# Patient Record
Sex: Male | Born: 2000 | Hispanic: No | Marital: Single | Smoking: Never smoker
Health system: Southern US, Community
[De-identification: ages and names within clinical notes are randomized; demographics above are authoritative.]

---

## 2019-01-26 ENCOUNTER — Ambulatory Visit
Admission: EM | Admit: 2019-01-26 | Discharge: 2019-01-26 | Disposition: A | Discharge status: Home / Self Care | Payer: Medicaid Other | Attending: Emergency Medicine | Admitting: Emergency Medicine

## 2019-01-26 ENCOUNTER — Other Ambulatory Visit: Payer: Self-pay

## 2019-01-26 DIAGNOSIS — J038 Acute tonsillitis due to other specified organisms: Secondary | ICD-10-CM

## 2019-01-26 DIAGNOSIS — J351 Hypertrophy of tonsils: Secondary | ICD-10-CM | POA: Diagnosis present

## 2019-01-26 LAB — POCT RAPID STREP A (OFFICE): Rapid Strep A Screen: NEGATIVE

## 2019-01-26 MED ORDER — PREDNISONE 20 MG PO TABS: 20.0000 mg | ORAL_TABLET | Freq: Two times a day (BID) | ORAL | 0 refills | Status: AC

## 2019-01-26 NOTE — ED Triage Notes (Signed)
Pt states the right side of his jaw is swollen and is concerned may be an abscess developing

## 2019-01-26 NOTE — Discharge Instructions (Signed)
Strep test negative, will send out for culture and we will call you with results Get plenty of rest and push fluids Prednisone prescribed.  Take as directed and to completion Follow up with PCP if symptoms persists Return or go to ER if patient has any new or worsening symptoms such as fever, chills, nausea, vomiting, worsening sore throat, cough, abdominal pain, chest pain, changes in bowel or bladder habits, etc..Marland Kitchen

## 2019-01-26 NOTE — ED Provider Notes (Signed)
[facility]   [id number] 01/26/19 Arrival Time: 1738  CC: Tonsil swelling, concern for abscess  SUBJECTIVE: History from: patient.  Marc Frank is a 18 y.o. male who presents with right tonsil swelling x 1.5 weeks.  Denies to sick exposure to COVID, strep, flu or mono, or precipitating event.  Has NOT tried OTC medications  Symptoms are made slightly worse with swallowing, but tolerating liquids and own secretions without difficulty.  Reports previous symptoms in the past and diagnose with an tonsil abscess.  Was treated with antibiotics with relief.   Denies fever, chills, fatigue, ear pain, sinus pain, rhinorrhea, nasal congestion, dyspnea, odynophagia, tongue swelling, drooling, cough, SOB, wheezing, chest pain, nausea, rash, changes in bowel or bladder habits.     ROS: As per HPI.  All other pertinent ROS negative.     History reviewed. No pertinent past medical history. History reviewed. No pertinent surgical history. No Known Allergies No current facility-administered medications on file prior to encounter.    No current outpatient medications on file prior to encounter.   Social History   Socioeconomic History  . Marital status: Single    Spouse name: Not on file  . Number of children: Not on file  . Years of education: Not on file  . Highest education level: Not on file  Occupational History  . Not on file  Social Needs  . Financial resource strain: Not on file  . Food insecurity    Worry: Not on file    Inability: Not on file  . Transportation needs    Medical: Not on file    Non-medical: Not on file  Tobacco Use  . Smoking status: Never Smoker  . Smokeless tobacco: Never Used  Substance and Sexual Activity  . Alcohol use: Not Currently  . Drug use: Never  . Sexual activity: Not on file  Lifestyle  . Physical activity    Days per week: Not on file    Minutes per session: Not on file  . Stress: Not on file  Relationships  . Social Wellsite geologist on phone: Not on file    Gets together: Not on file    Attends religious service: Not on file    Active member of club or organization: Not on file    Attends meetings of clubs or organizations: Not on file    Relationship status: Not on file  . Intimate partner violence    Fear of current or ex partner: Not on file    Emotionally abused: Not on file    Physically abused: Not on file    Forced sexual activity: Not on file  Other Topics Concern  . Not on file  Social History Narrative  . Not on file   Family History  Family history unknown: Yes    OBJECTIVE:  Vitals:   01/26/19 1749  BP: (!) 142/97  Pulse: 78  Resp: 20  Temp: 99.1 F (37.3 C)  SpO2: 97%     General appearance: alert; appear, well-appearing, nontoxic, speaking in full sentences and managing own secretions HEENT: NCAT; Ears: EACs clear, TMs pearly gray with visible cone of light, without erythema; Eyes: PERRL, EOMI grossly; Nose: no obvious rhinorrhea; Throat: oropharynx clear, tonsils 1+ R>L and not erythematous, no white tonsillar exudates, uvula midline Neck: supple without LAD Lungs: CTA bilaterally without adventitious breath sounds; cough absent Heart: regular rate and rhythm.  Radial pulses 2+ symmetrical bilaterally Skin: warm and dry Psychological: alert and cooperative;  normal mood and affect  LABS: Results for orders placed or performed during the hospital encounter of 01/26/19 (from the past 24 hour(s))  POCT rapid strep A     Status: None   Collection Time: 01/26/19  6:18 PM  Result Value Ref Range   Rapid Strep A Screen Negative Negative     ASSESSMENT & PLAN:  1. Acute tonsillitis due to other specified organisms   2. Swelling of tonsil     Meds ordered this encounter  Medications  . predniSONE (DELTASONE) 20 MG tablet    Sig: Take 1 tablet (20 mg total) by mouth 2 (two) times daily with a meal for 5 days.    Dispense:  10 tablet    Refill:  0    Order Specific  Question:   Supervising Provider    Answer:   Raylene Everts [[id number]]   Strep test negative, will send out for culture and we will call you with results Get plenty of rest and push fluids Prednisone prescribed.  Take as directed and to completion Follow up with PCP if symptoms persists Return or go to ER if patient has any new or worsening symptoms such as fever, chills, nausea, vomiting, worsening sore throat, cough, abdominal pain, chest pain, changes in bowel or bladder habits, etc...  Reviewed expectations re: course of current medical issues. Questions answered. Outlined signs and symptoms indicating need for more acute intervention. Patient verbalized understanding. After Visit Summary given.        Lestine Box, PA-C 01/27/19 1015

## 2019-01-30 LAB — CULTURE, GROUP A STREP (THRC)

## 2019-02-21 ENCOUNTER — Emergency Department (HOSPITAL_COMMUNITY): Payer: Medicaid Other

## 2019-02-21 ENCOUNTER — Encounter (HOSPITAL_COMMUNITY): Payer: Self-pay

## 2019-02-21 ENCOUNTER — Emergency Department (HOSPITAL_COMMUNITY)
Admission: EM | Admit: 2019-02-21 | Discharge: 2019-02-21 | Disposition: A | Discharge status: Home / Self Care | Payer: Medicaid Other | Attending: Emergency Medicine | Admitting: Emergency Medicine

## 2019-02-21 ENCOUNTER — Other Ambulatory Visit: Payer: Self-pay

## 2019-02-21 DIAGNOSIS — K047 Periapical abscess without sinus: Secondary | ICD-10-CM | POA: Insufficient documentation

## 2019-02-21 DIAGNOSIS — R252 Cramp and spasm: Secondary | ICD-10-CM | POA: Diagnosis not present

## 2019-02-21 DIAGNOSIS — R600 Localized edema: Secondary | ICD-10-CM | POA: Diagnosis present

## 2019-02-21 LAB — I-STAT CHEM 8, ED
BUN: 8 mg/dL (ref 6–20)
Calcium, Ion: 1.26 mmol/L (ref 1.15–1.40)
Chloride: 102 mmol/L (ref 98–111)
Creatinine, Ser: 0.9 mg/dL (ref 0.61–1.24)
Glucose, Bld: 80 mg/dL (ref 70–99)
HCT: 46 % (ref 39.0–52.0)
Hemoglobin: 15.6 g/dL (ref 13.0–17.0)
Potassium: 4.1 mmol/L (ref 3.5–5.1)
Sodium: 141 mmol/L (ref 135–145)
TCO2: 27 mmol/L (ref 22–32)

## 2019-02-21 MED ORDER — CLINDAMYCIN HCL 150 MG PO CAPS: 300.0000 mg | ORAL_CAPSULE | Freq: Three times a day (TID) | ORAL | 0 refills | Status: DC

## 2019-02-21 MED ORDER — CLINDAMYCIN HCL 150 MG PO CAPS
300.0000 mg | ORAL_CAPSULE | Freq: Once | ORAL | Status: AC
  Administered 2019-02-21: 300 mg via ORAL
  Filled 2019-02-21: qty 2

## 2019-02-21 MED ORDER — IOHEXOL 300 MG/ML  SOLN
75.0000 mL | Freq: Once | INTRAMUSCULAR | Status: AC | PRN
  Administered 2019-02-21: 75 mL via INTRAVENOUS

## 2019-02-21 NOTE — ED Provider Notes (Signed)
[facility] EMERGENCY DEPARTMENT Provider Note   CSN: [id number] Arrival date & time: 02/21/19  1418     History   Chief Complaint Chief Complaint  Patient presents with  . Facial Swelling    HPI Marc Frank is a 18 y.o. male.  He presents emergency department with chief complaint of right facial swelling.  He was seen several days ago at an urgent care diagnosed with tonsillitis but states that he did not have any tonsillar pain that he has had pain in his right lower jaw which has been progressively worsening and is now markedly edematous and swollen.  He is has associated trismus without difficulty swallowing.  Patient denies fever or chills.  He is not taking any antibiotics.     HPI  History reviewed. No pertinent past medical history.  There are no active problems to display for this patient.   History reviewed. No pertinent surgical history.      Home Medications    Prior to Admission medications   Not on File    Family History Family History  Family history unknown: Yes    Social History Social History   Tobacco Use  . Smoking status: Never Smoker  . Smokeless tobacco: Never Used  Substance Use Topics  . Alcohol use: Not Currently  . Drug use: Never     Allergies   Patient has no known allergies.   Review of Systems Review of Systems  Ten systems reviewed and are negative for acute change, except as noted in the HPI.   Physical Exam Updated Vital Signs BP 134/68 (BP Location: Right Arm)   Pulse 89   Temp 98.5 F (36.9 C) (Oral)   Resp 18   Ht 6\' 3"  (1.905 m)   Wt 113.4 kg   SpO2 98%   BMI 31.25 kg/m   Physical Exam Vitals signs and nursing note reviewed.  Constitutional:      General: He is not in acute distress.    Appearance: He is well-developed. He is not diaphoretic.  HENT:     Head: Normocephalic and atraumatic.  Eyes:     General: No scleral icterus.    Conjunctiva/sclera: Conjunctivae normal.     Comments: Poor  dentition throughout most notably in the right lower molars.  Marked facial edema over the right mandible, involvement of the masseter with associated trismus.  Uvula is midline, no tonsillar hypertrophy, erythema or exudate noted.  Firm indurated swelling over the mandible on the right side extends beyond the angle of the mandible into the hypopharyngeal region and superior aspect of the anterior neck just below the angle of the mandible.  Neck:     Musculoskeletal: Normal range of motion and neck supple.  Cardiovascular:     Rate and Rhythm: Normal rate and regular rhythm.     Heart sounds: Normal heart sounds.  Pulmonary:     Effort: Pulmonary effort is normal. No respiratory distress.     Breath sounds: Normal breath sounds.  Abdominal:     Palpations: Abdomen is soft.     Tenderness: There is no abdominal tenderness.  Skin:    General: Skin is warm and dry.  Neurological:     Mental Status: He is alert.  Psychiatric:        Behavior: Behavior normal.      ED Treatments / Results  Labs (all labs ordered are listed, but only abnormal results are displayed) Labs Reviewed - No data to display  EKG None  Radiology No results found.  Procedures Procedures (including critical care time)  Medications Ordered in ED Medications - No data to display   Initial Impression / Assessment and Plan / ED Course  I have reviewed the triage vital signs and the nursing notes.  Pertinent labs & imaging results that were available during my care of the patient were reviewed by me and considered in my medical decision making (see chart for details).       18 year old male here with significant swelling and trismus.  Personally reviewed the patient's lab work which shows no abnormalities.  CT maxillofacial shows significant edema and fluid collection suggestive of odontogenic infection.  He has surrounding inflammatory changes of the masseter and pterygoid.  There are some reactive  lymphadenopathy present as well on my interpretation. Patient will be given clindamycin for treatment with outpatient dental follow-up.  I am unable to palpate a area of fluctuance in order to drain this abscess.  He has no evidence of Ludwick's angina or spread of infection into the anterior neck spaces.  His uvula is midline, he has normal phonation and swallowing appropriately with tolerance of his own saliva.  Patient appears appropriate for discharge at this time with close outpatient dental follow-up.  I discussed return precautions.  Final Clinical Impressions(s) / ED Diagnoses   Final diagnoses:  None    ED Discharge Orders    None       Margarita Mail, PA-C 02/21/19 1942    Noemi Chapel, MD 02/22/19 [phone]

## 2019-02-21 NOTE — Discharge Instructions (Signed)
Contact a health care provider if: °Your pain is worse and is not helped by medicine. °Get help right away if: °You have a fever or chills. °Your symptoms suddenly get worse. °You have a very bad headache. °You have problems breathing or swallowing. °You have trouble opening your mouth. °You have swelling in your neck or around your eye. °

## 2019-02-21 NOTE — ED Triage Notes (Signed)
Pt reports r jaw swollen x 1 month.  Denies any pain or injury.

## 2019-05-01 ENCOUNTER — Encounter (HOSPITAL_COMMUNITY): Payer: Self-pay | Admitting: Emergency Medicine

## 2019-05-01 ENCOUNTER — Emergency Department (HOSPITAL_COMMUNITY)
Admission: EM | Admit: 2019-05-01 | Discharge: 2019-05-01 | Disposition: A | Discharge status: Home / Self Care | Payer: Medicaid Other | Attending: Emergency Medicine | Admitting: Emergency Medicine

## 2019-05-01 ENCOUNTER — Other Ambulatory Visit: Payer: Self-pay

## 2019-05-01 DIAGNOSIS — K029 Dental caries, unspecified: Secondary | ICD-10-CM | POA: Insufficient documentation

## 2019-05-01 DIAGNOSIS — R600 Localized edema: Secondary | ICD-10-CM | POA: Diagnosis present

## 2019-05-01 DIAGNOSIS — L0291 Cutaneous abscess, unspecified: Secondary | ICD-10-CM

## 2019-05-01 DIAGNOSIS — L0201 Cutaneous abscess of face: Secondary | ICD-10-CM | POA: Insufficient documentation

## 2019-05-01 MED ORDER — CLINDAMYCIN HCL 300 MG PO CAPS: 300.0000 mg | ORAL_CAPSULE | Freq: Three times a day (TID) | ORAL | 0 refills | Status: AC

## 2019-05-01 MED ORDER — CLINDAMYCIN HCL 150 MG PO CAPS
300.0000 mg | ORAL_CAPSULE | Freq: Once | ORAL | Status: AC
  Administered 2019-05-01: 300 mg via ORAL
  Filled 2019-05-01: qty 2

## 2019-05-01 NOTE — ED Triage Notes (Signed)
PT STATES THAT HE HAS AN INGROWN HAIR ON THE RIGHT SIDE OF HIS JAW

## 2019-05-01 NOTE — Discharge Instructions (Signed)
Warm compress to your face.  Take the antibiotics has needed.  If you if you decide that you do want this abscess drained which he did not want today please seek reevaluation or urgent care or here in the emergency department

## 2019-05-01 NOTE — ED Provider Notes (Signed)
[facility] Provider Note   CSN: [id number] Arrival date & time: 05/01/19  1439     History Chief Complaint  Patient presents with  . Abscess    Marc Frank is a 19 y.o. male with past medical history significant for right-sided facial swelling.  Patient states he shaved a week and a half ago and since then has noted a possible ingrown hair and facial swelling to the right side of his face.  Patient denies any dental pain.  Patient states that he did have similar pain a couple months ago and was seen here in the emergency department.  Patient diagnosed with abscess likely due to dental infection.  He was started on clindamycin.  Patient states the clindamycin resolved his facial swelling.  He is not currently taking any antibiotics.  Patient denies fever, chills, nausea, vomiting, drooling, dysphagia, trismus.  He is able to tolerate p.o. intake without difficulty.  Denies any oral swelling.  Rates his current pain a 2/10.  Denies additional aggravating or alleviating factors.  History obtained from patient and past medical records.  No interpreter is used.  HPI     History reviewed. No pertinent past medical history.  There are no problems to display for this patient.   History reviewed. No pertinent surgical history.     Family History  Family history unknown: Yes    Social History   Tobacco Use  . Smoking status: Never Smoker  . Smokeless tobacco: Never Used  Substance Use Topics  . Alcohol use: Not Currently  . Drug use: Never    Home Medications Prior to Admission medications   Medication Sig Start Date End Date Taking? Authorizing Provider  clindamycin (CLEOCIN) 300 MG capsule Take 1 capsule (300 mg total) by mouth 3 (three) times daily for 5 days. 05/01/19 05/06/19  Reilley Latorre A, PA-C    Allergies    Patient has no known allergies.  Review of Systems   Review of Systems  Constitutional: Negative.   HENT: Positive for facial  swelling. Negative for congestion, drooling, ear pain, hearing loss, mouth sores, nosebleeds, postnasal drip, rhinorrhea, sinus pressure, sinus pain, sore throat, trouble swallowing and voice change.   Eyes: Negative.   Respiratory: Negative.   Cardiovascular: Negative.   Gastrointestinal: Negative.   Genitourinary: Negative.   Musculoskeletal: Negative.   Skin: Negative.   Neurological: Negative.   All other systems reviewed and are negative.   Physical Exam Updated Vital Signs BP 132/69 (BP Location: Right Arm)   Pulse 96   Temp 98.5 F (36.9 C) (Oral)   Resp 14   Ht 6\' 3"  (1.905 m)   Wt 113.4 kg   SpO2 98%   BMI 31.25 kg/m   Physical Exam Vitals and nursing note reviewed.  Constitutional:      General: He is not in acute distress.    Appearance: He is well-developed. He is not ill-appearing, toxic-appearing or diaphoretic.  HENT:     Head: Atraumatic.     Jaw: There is normal jaw occlusion.     Comments: Facial swelling just superior to his right mandible.  This is not extend into his submandibular region.  He does have a 5 cm area of induration to his right supra mandibular region with 1 cm area of fluctuance to the most inferior aspect.  No drooling, dysphagia or trismus.  No oral jaw occlusion.  No evidence of Ludwigs angina.    Nose: Nose normal.     Mouth/Throat:  Lips: Pink.     Mouth: Mucous membranes are moist.     Pharynx: Oropharynx is clear. Uvula midline.     Tonsils: No tonsillar exudate or tonsillar abscesses. 0 on the right. 0 on the left.     Comments: Poor dentition with multiple dental caries.  Tongue midline.  No evidence of sublingual or submandibular swelling.  No drooling, dysphagia or trismus.  Tonsils without edema or exudate.  Uvula midline without deviation Eyes:     Pupils: Pupils are equal, round, and reactive to light.  Neck:     Trachea: Phonation normal.     Comments: No neck stiffness or neck rigidity.  Phonation  normal Cardiovascular:     Rate and Rhythm: Normal rate and regular rhythm.  Pulmonary:     Effort: Pulmonary effort is normal. No respiratory distress.  Abdominal:     General: There is no distension.     Palpations: Abdomen is soft.  Musculoskeletal:        General: Normal range of motion.     Cervical back: Full passive range of motion without pain, normal range of motion and neck supple.  Skin:    General: Skin is warm and dry.     Capillary Refill: Capillary refill takes less than 2 seconds.     Findings: Abscess present.     Comments: Right supra mandibular jaw swelling.  With fluctuance and induration.  There is punctate area of purulent drainage.  No erythema or warmth.  Neurological:     Mental Status: He is alert.     ED Results / Procedures / Treatments   Labs (all labs ordered are listed, but only abnormal results are displayed) Labs Reviewed - No data to display  EKG None  Radiology No results found.  Procedures Procedures (including critical care time)  Medications Ordered in ED Medications  clindamycin (CLEOCIN) capsule 300 mg (has no administration in time range)    ED Course  I have reviewed the triage vital signs and the nursing notes.  Pertinent labs & imaging results that were available during my care of the patient were reviewed by me and considered in my medical decision making (see chart for details).  19 year old male appears otherwise well presents for evaluation of right-sided facial swelling.  Swelling began 1/2 weeks ago after shaving.  He has no drooling, dysphagia or trismus.  He does have marked area of induration to his right supra mandibular area.  His induration does not extend to his sublingual or submandibular region.  Is no evidence of Ludwick's angina.  He is tolerating p.o. intake at home.  He does have overall poor dentition however I do not see any evidence of drainable periapical abscess.  He has 1 cm area of fluctuance to the  inferior most aspect of the induration to his super mandibular region and there is some mild purulent drainage.  There is no overlying warmth or erythema.  No obvious area of folliculitis.  He was seen here 2 months ago for similar symptoms had CT scan which showed dental abscess.  Patient states the antibiotics and warm compresses help to resolve this area.  He did not follow-up outpatient.   I offered patient further drainage in the emergency department of his facial abscess discussed risk versus benefit to include incomplete drainage, cosmetic repercussions, possible deep space infection which would require labs and CT imaging.  Patient does not want any labs, CT imaging or drainage here in the emergency department.  Patient  would like to try warm compresses and antibiotics.  Given he has no drooling, dysphagia, trismus, no systemic symptoms and does not have any evidence of Ludwig's angina or deep space infection we will start him on antibiotics and have him follow-up with maxillofacial surgery.  I discussed strict return precautions.  Patient voiced understanding and is agreeable follow-up.  The patient has been appropriately medically screened and/or stabilized in the ED. I have low suspicion for any other emergent medical condition which would require further screening, evaluation or treatment in the ED or require inpatient management.  Patient is hemodynamically stable and in no acute distress.  Patient able to ambulate in department prior to ED.  Evaluation does not show acute pathology that would require ongoing or additional emergent interventions while in the emergency department or further inpatient treatment.  I have discussed the diagnosis with the patient and answered all questions.  Pain is been managed while in the emergency department and patient has no further complaints prior to discharge.  Patient is comfortable with plan discussed in room and is stable for discharge at this time.  I have  discussed strict return precautions for returning to the emergency department.  Patient was encouraged to follow-up with PCP/specialist refer to at discharge.    MDM Rules/Calculators/A&P                       Final Clinical Impression(s) / ED Diagnoses Final diagnoses:  Abscess    Rx / DC Orders ED Discharge Orders         Ordered    clindamycin (CLEOCIN) 300 MG capsule  3 times daily     05/01/19 1707           Quanita Barona A, PA-C 05/01/19 1708    Bethann Berkshire, MD 05/01/19 1955

## 2019-05-17 ENCOUNTER — Encounter (HOSPITAL_COMMUNITY): Payer: Self-pay | Admitting: *Deleted

## 2019-05-17 ENCOUNTER — Other Ambulatory Visit: Payer: Self-pay

## 2019-05-17 ENCOUNTER — Emergency Department (HOSPITAL_COMMUNITY)
Admission: EM | Admit: 2019-05-17 | Discharge: 2019-05-17 | Disposition: A | Discharge status: Home / Self Care | Payer: Worker's Compensation | Attending: Emergency Medicine | Admitting: Emergency Medicine

## 2019-05-17 ENCOUNTER — Emergency Department (HOSPITAL_COMMUNITY): Payer: Worker's Compensation

## 2019-05-17 DIAGNOSIS — Y9301 Activity, walking, marching and hiking: Secondary | ICD-10-CM | POA: Insufficient documentation

## 2019-05-17 DIAGNOSIS — Y990 Civilian activity done for income or pay: Secondary | ICD-10-CM | POA: Insufficient documentation

## 2019-05-17 DIAGNOSIS — M545 Low back pain, unspecified: Secondary | ICD-10-CM

## 2019-05-17 DIAGNOSIS — S63502A Unspecified sprain of left wrist, initial encounter: Secondary | ICD-10-CM | POA: Insufficient documentation

## 2019-05-17 DIAGNOSIS — W010XXA Fall on same level from slipping, tripping and stumbling without subsequent striking against object, initial encounter: Secondary | ICD-10-CM | POA: Insufficient documentation

## 2019-05-17 DIAGNOSIS — Y92511 Restaurant or cafe as the place of occurrence of the external cause: Secondary | ICD-10-CM | POA: Insufficient documentation

## 2019-05-17 MED ORDER — IBUPROFEN 400 MG PO TABS
600.0000 mg | ORAL_TABLET | Freq: Once | ORAL | Status: AC
  Administered 2019-05-17: 600 mg via ORAL

## 2019-05-17 MED ORDER — METHOCARBAMOL 500 MG PO TABS
1000.0000 mg | ORAL_TABLET | Freq: Once | ORAL | Status: AC
  Administered 2019-05-17: 07:00:00 1000 mg via ORAL

## 2019-05-17 NOTE — Discharge Instructions (Addendum)
Use ice packs over the areas that are painful.  Wear the Velcro wrist splint on your left wrist.  Please call the occupational medicine offices to be seen today for your Workmen's Comp injury.  They will guide the rest of your therapy.

## 2019-05-17 NOTE — ED Provider Notes (Signed)
[facility] EMERGENCY DEPARTMENT Provider Note   CSN: [id number] Arrival date & time: 05/17/19  0524   Time seen 5:45 AM  History Chief Complaint  Patient presents with  . Fall    Marc Frank is a 19 y.o. male.  HPI   Patient states he was working at [employer] and he was walking towards the back and when he passed the ovens there was some water on the floor and he slipped and fell backwards.  He put his left hand behind him to catch himself.  He denies hitting his head or having loss of consciousness.  He complains of pain in his lower back and in his left wrist which is still hurting but is improving.  Patient is right-handed.  He denies any injuries to his legs or any numbness or tingling in his legs.  He states that after he laid on the floor while waiting for EMS to come he was able to get up and he could walk.  He denies nausea, vomiting, or blurred vision.  PCP Patient, No Pcp Per   History reviewed. No pertinent past medical history.  There are no problems to display for this patient.   History reviewed. No pertinent surgical history.     Family History  Family history unknown: Yes    Social History   Tobacco Use  . Smoking status: Never Smoker  . Smokeless tobacco: Never Used  Substance Use Topics  . Alcohol use: Not Currently  . Drug use: Never  employed  Home Medications Prior to Admission medications   Not on File    Allergies    Patient has no known allergies.  Review of Systems   Review of Systems  All other systems reviewed and are negative.   Physical Exam Updated Vital Signs BP (!) 122/59   Pulse 78   Temp 98.1 F (36.7 C) (Oral)   Resp 16   Ht 6\' 3"  (1.905 m)   Wt 113.4 kg   SpO2 99%   BMI 31.25 kg/m   Physical Exam Vitals and nursing note reviewed.  Constitutional:      Appearance: Normal appearance. He is obese.  HENT:     Head: Normocephalic and atraumatic.     Comments: Nontender, no obvious injury seen    Nose:  Nose normal.  Eyes:     Extraocular Movements: Extraocular movements intact.     Conjunctiva/sclera: Conjunctivae normal.     Pupils: Pupils are equal, round, and reactive to light.  Cardiovascular:     Rate and Rhythm: Normal rate and regular rhythm.  Pulmonary:     Effort: Pulmonary effort is normal. No respiratory distress.  Musculoskeletal:     Cervical back: Normal range of motion and neck supple. No tenderness.       Back:     Right lower leg: No edema.     Left lower leg: No edema.     Comments: Patient is tender in his lower lumbar spine in the midline.  He is nontender over the paraspinous muscles or in the flank area on either side.  He is nontender in the thoracic spine or the cervical spine.  Patient's wrist is nontender to palpation or with dorsi flexion and extension, however when he moves his right thumb it causes pain in the radial aspect of his wrist.  He has good distal pulses.  Skin:    General: Skin is warm and dry.  Neurological:     General: No focal deficit present.  Mental Status: He is alert and oriented to person, place, and time.     Cranial Nerves: No cranial nerve deficit.  Psychiatric:        Mood and Affect: Mood normal.        Behavior: Behavior normal.        Thought Content: Thought content normal.     ED Results / Procedures / Treatments   Labs (all labs ordered are listed, but only abnormal results are displayed) Labs Reviewed - No data to display  EKG None  Radiology DG Lumbar Spine Complete  Result Date: 05/17/2019 CLINICAL DATA:  Larey Seat backwards at work this morning EXAM: LUMBAR SPINE - COMPLETE 4+ VIEW COMPARISON:  None. FINDINGS: Transitional anatomy with only 4 non-rib-bearing lumbar type vertebral bodies. No acute fracture or vertebral body height loss is seen. No traumatic listhesis. No significant degenerative changes. No worrisome osseous lesions. IMPRESSION: No acute osseous abnormality. Likely variant transitional anatomy  with only 4 non-rib-bearing lumbar type vertebral bodies. Electronically Signed   By: Kreg Shropshire M.D.   On: 05/17/2019 06:30   DG Wrist Complete Left  Result Date: 05/17/2019 CLINICAL DATA:  Fall with left wrist pain laterally EXAM: LEFT WRIST - COMPLETE 3+ VIEW COMPARISON:  None. FINDINGS: Mild left wrist swelling. No acute bony abnormality. Specifically, no fracture, subluxation, or dislocation. IMPRESSION: Mild left wrist swelling without underlying osseous abnormality. Electronically Signed   By: Kreg Shropshire M.D.   On: 05/17/2019 06:31    Procedures Procedures (including critical care time)  Medications Ordered in ED Medications  ibuprofen (ADVIL) tablet 600 mg (600 mg Oral Given 05/17/19 1655)  methocarbamol (ROBAXIN) tablet 1,000 mg (1,000 mg Oral Given 05/17/19 3748)    ED Course  I have reviewed the triage vital signs and the nursing notes.  Pertinent labs & imaging results that were available during my care of the patient were reviewed by me and considered in my medical decision making (see chart for details).    MDM Rules/Calculators/A&P                      Patient was given Motrin and Robaxin for his pain.  He was placed in a Velcro wrist splint on his left wrist, we discussed treatment of sprain wrist.  He also should follow-up today at occupational medication. Final Clinical Impression(s) / ED Diagnoses Final diagnoses:  Fall due to wet surface, initial encounter  Acute midline low back pain without sciatica  Sprain of left wrist, initial encounter    Rx / DC Orders ED Discharge Orders    None     Plan discharge  Devoria Albe, MD, Concha Pyo, MD 05/17/19 [phone]

## 2019-05-17 NOTE — ED Triage Notes (Signed)
Pt brought in by [facility] for c/o fall while at work; pt states he was walking to the freezer to get out some sausage when he slipped and fell near the stoves, landing on his back and left wrist

## 2019-07-15 ENCOUNTER — Other Ambulatory Visit: Payer: Self-pay

## 2019-07-15 ENCOUNTER — Ambulatory Visit: Payer: Medicaid Other | Attending: Internal Medicine

## 2019-07-15 DIAGNOSIS — Z20822 Contact with and (suspected) exposure to covid-19: Secondary | ICD-10-CM | POA: Insufficient documentation

## 2019-07-16 LAB — SARS-COV-2, NAA 2 DAY TAT

## 2019-07-16 LAB — NOVEL CORONAVIRUS, NAA: SARS-CoV-2, NAA: NOT DETECTED

## 2021-07-12 ENCOUNTER — Other Ambulatory Visit: Payer: Self-pay | Admitting: Physician Assistant

## 2021-07-12 DIAGNOSIS — K3532 Acute appendicitis with perforation and localized peritonitis, without abscess: Secondary | ICD-10-CM

## 2021-07-15 ENCOUNTER — Other Ambulatory Visit: Payer: Self-pay | Admitting: Physician Assistant

## 2021-07-15 DIAGNOSIS — K3532 Acute appendicitis with perforation and localized peritonitis, without abscess: Secondary | ICD-10-CM

## 2021-07-23 ENCOUNTER — Ambulatory Visit
Admission: RE | Admit: 2021-07-23 | Discharge: 2021-07-23 | Disposition: A | Discharge status: Home / Self Care | Payer: Medicaid Other | Source: Ambulatory Visit | Attending: Physician Assistant | Admitting: Physician Assistant

## 2021-07-23 ENCOUNTER — Encounter: Payer: Self-pay | Admitting: *Deleted

## 2021-07-23 DIAGNOSIS — K3532 Acute appendicitis with perforation and localized peritonitis, without abscess: Secondary | ICD-10-CM

## 2021-07-23 HISTORY — PX: IR RADIOLOGIST EVAL & MGMT: IMG5224

## 2021-07-23 MED ORDER — IOPAMIDOL (ISOVUE-300) INJECTION 61%
100.0000 mL | Freq: Once | INTRAVENOUS | Status: AC | PRN
  Administered 2021-07-23: 100 mL via INTRAVENOUS

## 2021-07-23 NOTE — Progress Notes (Signed)
? ? ?Chief Complaint: ?Patient was seen in consultation today for drain management ? ?Referring Physician(s): ?Mohamed-Omar Samir Arafeh ? ?History of Present Illness: ?Marc Frank is a 21 y.o. male with history of perforated appendicitis and was admitted at [facility] on 07/04/2021.  Patient had a CT-guided transgluteal pelvic abscess drain placed on 07/08/2021.  Patient was discharged from the hospital on 07/12/2021.  Patient presents for drain follow-up.  He says that he is feeling well with minimal discomfort.  Patient is not complaining of fevers.  Patient reports minimal output from the drain.  He says the output is relatively clear. ? ?Allergies: ?Patient has no known allergies. ? ?Medications: ?Prior to Admission medications   ?Not on File  ?  ? ?Family History  ?Family history unknown: Yes  ? ? ?Social History  ? ?Socioeconomic History  ? Marital status: Single  ?  Spouse name: Not on file  ? Number of children: Not on file  ? Years of education: Not on file  ? Highest education level: Not on file  ?Occupational History  ? Not on file  ?Tobacco Use  ? Smoking status: Never  ? Smokeless tobacco: Never  ?Substance and Sexual Activity  ? Alcohol use: Not Currently  ? Drug use: Never  ? Sexual activity: Not on file  ?Other Topics Concern  ? Not on file  ?Social History Narrative  ? Not on file  ? ?Social Determinants of Health  ? ?Financial Resource Strain: Not on file  ?Food Insecurity: Not on file  ?Transportation Needs: Not on file  ?Physical Activity: Not on file  ?Stress: Not on file  ?Social Connections: Not on file  ? ? ? ? ?Review of Systems  ?Constitutional:  Negative for fever.  ? ?Vital Signs: ?There were no vitals taken for this visit. ? ?Physical Exam ?Constitutional:   ?   Appearance: He is not ill-appearing.  ?Pulmonary:  ?   Effort: Pulmonary effort is normal.  ?Abdominal:  ?   Comments: Transgluteal drain intact  ?Neurological:  ?   Mental Status: He is alert.  ?   ? ?Imaging: ?CT ABDOMEN PELVIS W CONTRAST ? ?Result Date: 07/23/2021 ?CLINICAL DATA:  21 year old with history of perforated appendicitis. Status post transgluteal percutaneous drain placement. EXAM: CT ABDOMEN AND PELVIS WITH CONTRAST TECHNIQUE: Multidetector CT imaging of the abdomen and pelvis was performed using the standard protocol following bolus administration of intravenous contrast. RADIATION DOSE REDUCTION: This exam was performed according to the departmental dose-optimization program which includes automated exposure control, adjustment of the mA and/or kV according to patient size and/or use of iterative reconstruction technique. CONTRAST:  ISOVUE-300 IOPAMIDOL (ISOVUE-300) INJECTION 61% COMPARISON:  CT abdomen and pelvis 07/07/2021 FINDINGS: Lower chest: Lung bases are clear. Hepatobiliary: No focal liver abnormality is seen. No gallstones, gallbladder wall thickening, or biliary dilatation. Pancreas: Unremarkable. No pancreatic ductal dilatation or surrounding inflammatory changes. Spleen: Normal in size without focal abnormality. Adrenals/Urinary Tract: Normal adrenal glands. Normal appearance of both kidneys without hydronephrosis. No suspicious renal lesions. Normal urinary bladder. Stomach/Bowel: Normal appearance of the stomach. Small bowel dilatation have resolved. Appendix is decompressed but the tip is near an intra-abdominal air-fluid collection. Residual but decreased inflammatory changes in the right lower quadrant around the appendix. Colon is decompressed. Vascular/Lymphatic: Vascular structures are unremarkable. No lymph node enlargement in the abdomen or pelvis. Reproductive: Prostate is unremarkable. Other: Right transgluteal pigtail drain is present and the pelvic abscess collection has resolved. Several small intra-abdominal fluid collections  compatible with small abscess collections. There is a small air-fluid collection near the tip of the appendix on sequence 2 image 69 that  measures 4.3 x 2.6 cm. Additional lower abdominal air-fluid collection on sequence 2, image 62 that measures 3.0 x 2.6 cm. Fluid collection along the anterior distal descending colon measures 4.5 x 3.3 x 2.6 cm on sequence 2 image 50. Additional interloop fluid collection in the left abdomen on image 40 measures 3.8 x 2.3 x 3.9 cm. Musculoskeletal: No acute osseous abnormalities. IMPRESSION: 1. Pelvic abscess has resolved with the percutaneous drain. 2. Decreased inflammatory changes in the abdomen and pelvis but evidence for small intra-abdominal abscess collections. Two of the collections are in the lower abdomen near the tip of the appendix and the additional fluid collections are along the left lateral abdomen. 3. Small bowel dilatation has resolved.  Appendix is decompressed. Electronically Signed   By: Richarda Overlie M.D.   On: 07/23/2021 14:10   ? ? ? ?Assessment and Plan: ? ?21 year old with history of perforated appendicitis and status post percutaneous drain for pelvic abscess.  Patient has completed his antibiotics and had a CT scan today.  CT demonstrates that the pelvic abscess has resolved and the patient has minimal output from the drain.  However, the patient has multiple small intra-abdominal fluid collections that are suggestive for small residual abscess collections.  Overall, the inflammatory changes in the abdomen and pelvis have decreased.  The small bowel and appendix dilatation have resolved.  The transgluteal abscess drain was removed without complication.  Bandage was placed at the drain site.  Patient is scheduled to follow-up with Mohamed-Omar Shary Key and we discussed getting a follow-up CT in approximately 2 weeks to reassess the small intra-abdominal fluid/abscess collections. ? ? ?Electronically Signed: ?Arn Medal ?07/23/2021, 2:15 PM ? ? ?I spent a total of  10 minutes in face to face in clinical consultation, greater than 50% of which was counseling/coordinating care for drain  management. Patient ID: Marc Frank, male   DOB: January 08, 2001, 21 y.o.   MRN: [id number] ? ?

## 2021-07-24 ENCOUNTER — Other Ambulatory Visit: Payer: Self-pay | Admitting: Physician Assistant

## 2021-07-25 ENCOUNTER — Other Ambulatory Visit: Payer: Self-pay | Admitting: Physician Assistant

## 2021-07-25 DIAGNOSIS — K3532 Acute appendicitis with perforation and localized peritonitis, without abscess: Secondary | ICD-10-CM

## 2021-08-06 ENCOUNTER — Ambulatory Visit
Admission: RE | Admit: 2021-08-06 | Discharge: 2021-08-06 | Disposition: A | Discharge status: Home / Self Care | Payer: Medicaid Other | Source: Ambulatory Visit | Attending: Physician Assistant | Admitting: Physician Assistant

## 2021-08-06 ENCOUNTER — Encounter: Payer: Self-pay | Admitting: *Deleted

## 2021-08-06 DIAGNOSIS — K3532 Acute appendicitis with perforation and localized peritonitis, without abscess: Secondary | ICD-10-CM

## 2021-08-06 HISTORY — PX: IR RADIOLOGIST EVAL & MGMT: IMG5224

## 2021-08-06 MED ORDER — IOPAMIDOL (ISOVUE-300) INJECTION 61%
100.0000 mL | Freq: Once | INTRAVENOUS | Status: AC | PRN
  Administered 2021-08-06: 100 mL via INTRAVENOUS

## 2021-08-06 NOTE — Progress Notes (Addendum)
? ?Referring Physician(s): ?Dr Luciana Axe ? ?Chief Complaint: ?The patient is seen in follow up today s/p  ? ?History of present illness: ? ?Hx ruptured appendix ?TG drain placed in IR 07/08/21 ?Was seen in follow up 4/4 with Dr Lowella Dandy ?Collection was resolved and TG drain was removed ?At time of visit-- CT was performed which showed some new small collections in pelvis ?IMPRESSION: ?1. Pelvic abscess has resolved with the percutaneous drain. ?2. Decreased inflammatory changes in the abdomen and pelvis but ?evidence for small intra-abdominal abscess collections. Two of the ?collections are in the lower abdomen near the tip of the appendix ?and the additional fluid collections are along the left lateral ?abdomen. ?3. Small bowel dilatation has resolved.  Appendix is decompressed. ? ?Dr Lowella Dandy discussed with Surgeon ?Plan was to return to Clinic today for repeat CT and evaluation ?Scheduled today for same ? ?Pt is without complaint ?Denies fever; chills; abd pain ?Eating and drinking normally ?BMs wnl ? ? ?No past medical history on file. ? ?Past Surgical History:  ?Procedure Laterality Date  ? IR RADIOLOGIST EVAL & MGMT  07/23/2021  ? ? ?Allergies: ?Patient has no known allergies. ? ?Medications: ?Prior to Admission medications   ?Not on File  ?  ? ?Family History  ?Family history unknown: Yes  ? ? ?Social History  ? ?Socioeconomic History  ? Marital status: Single  ?  Spouse name: Not on file  ? Number of children: Not on file  ? Years of education: Not on file  ? Highest education level: Not on file  ?Occupational History  ? Not on file  ?Tobacco Use  ? Smoking status: Never  ? Smokeless tobacco: Never  ?Substance and Sexual Activity  ? Alcohol use: Not Currently  ? Drug use: Never  ? Sexual activity: Not on file  ?Other Topics Concern  ? Not on file  ?Social History Narrative  ? Not on file  ? ?Social Determinants of Health  ? ?Financial Resource Strain: Not on file  ?Food Insecurity: Not on file  ?Transportation  Needs: Not on file  ?Physical Activity: Not on file  ?Stress: Not on file  ?Social Connections: Not on file  ? ? ? ?Vital Signs: ?There were no vitals taken for this visit. ? ?Physical Exam ?Skin: ?   General: Skin is warm.  ?   Comments: Site of Rt TG drain is completely healed ?No redness ?No sign of infection  ? ? ?Imaging: ?No results found. ? ?Labs: ? ?CBC: ?No results for input(s): WBC, HGB, HCT, PLT in the last 8760 hours. ? ?COAGS: ?No results for input(s): INR, APTT in the last 8760 hours. ? ?BMP: ?No results for input(s): NA, K, CL, CO2, GLUCOSE, BUN, CALCIUM, CREATININE, GFRNONAA, GFRAA in the last 8760 hours. ? ?Invalid input(s): CMP ? ?LIVER FUNCTION TESTS: ?No results for input(s): BILITOT, AST, ALT, ALKPHOS, PROT, ALBUMIN in the last 8760 hours. ? ?Assessment: ? ?CT today was reviewed with Dr Lowella Dandy ?He reviewed images with pt and his mother ?Collections seen on 4/4 CT have virtually healed at this point. ?Mother says appendectomy is scheduled for May 24 ?Pt is to keep appt. ?No needs from IR standpoint- no further follow up ?Please call surgeon if any development of pain; fever; chills ?They leave here today with good understanding of this plan ? ?Signed: ?Robet Leu, PA-C ?08/06/2021, 1:05 PM ? ? ?Please refer to Dr. Lowella Dandy attestation of this note for management and plan.  ? ? ? ? ?  ?

## 2022-06-03 IMAGING — CT CT ABD-PELV W/ CM
2 of 4 series · 14 of 46 positions shown, 16 images · IV contrast (agent unspecified)
Comparison: CT abdomen pelvis 07/23/2021

CLINICAL DATA: 21-year-old with history of perforated appendicitis.
History of percutaneous drain that has been removed. Follow-up small
intra-abdominal fluid collections.

EXAM:
CT ABDOMEN AND PELVIS WITH CONTRAST
TECHNIQUE: Multidetector CT imaging of the abdomen and pelvis was performed
using the standard protocol following bolus administration of
intravenous contrast.

[Series 2: abd pelvis 5.00 br40 s3 axial · axial · 0.79mm/px · z∈[+1056,+1521]mm · 11 of 111 slices shown, 13 images]
[im 9/111  soft-tissue]
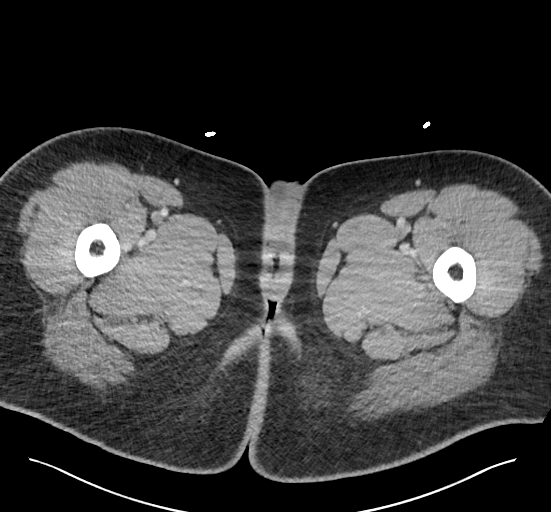
[im 9/111  bone]
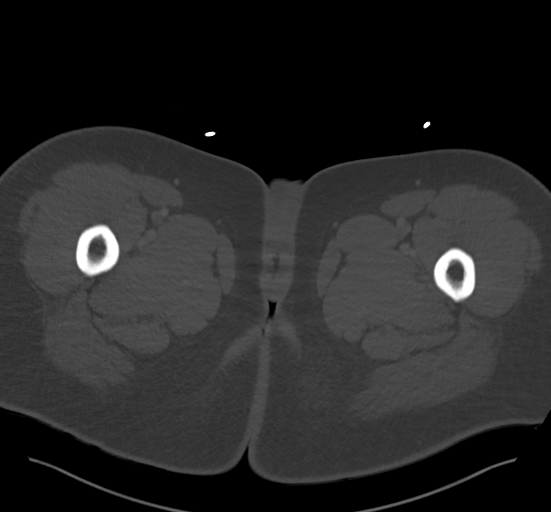
[im 17/111  soft-tissue]
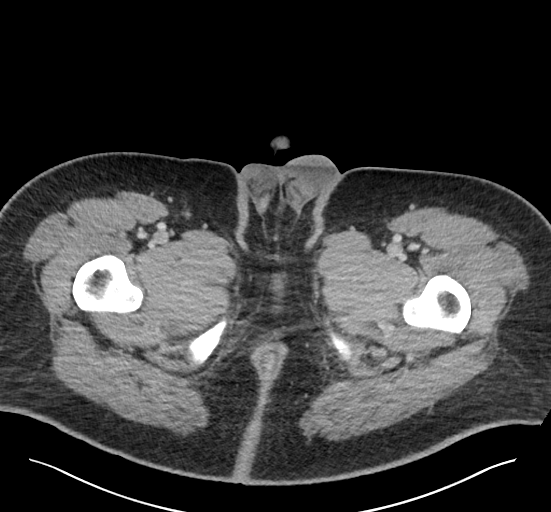
[im 25/111  soft-tissue]
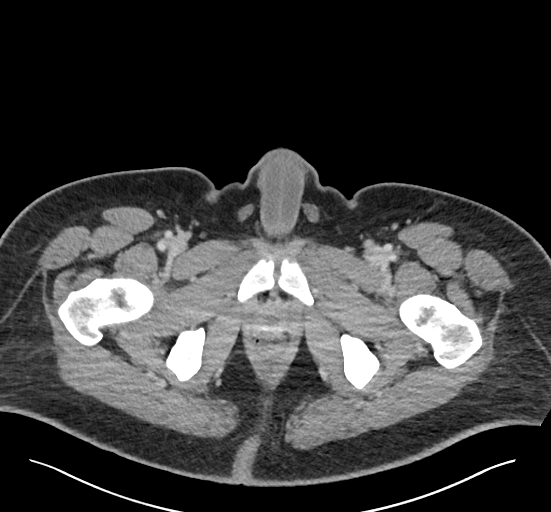
[im 37/111  soft-tissue]
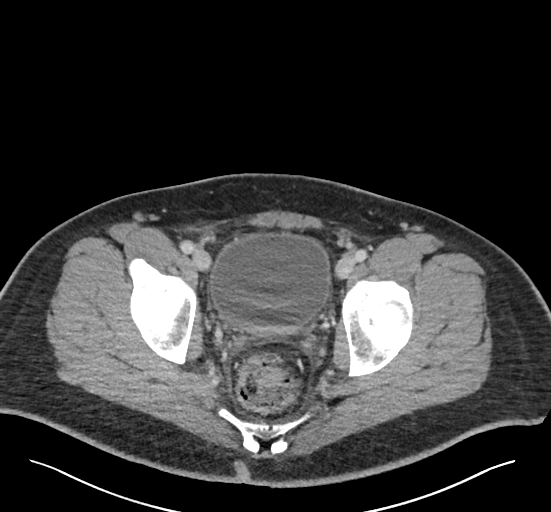
[im 45/111  soft-tissue]
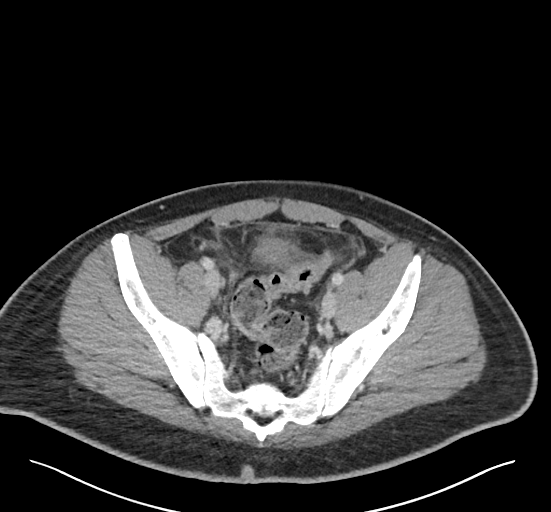
[im 58/111  soft-tissue]
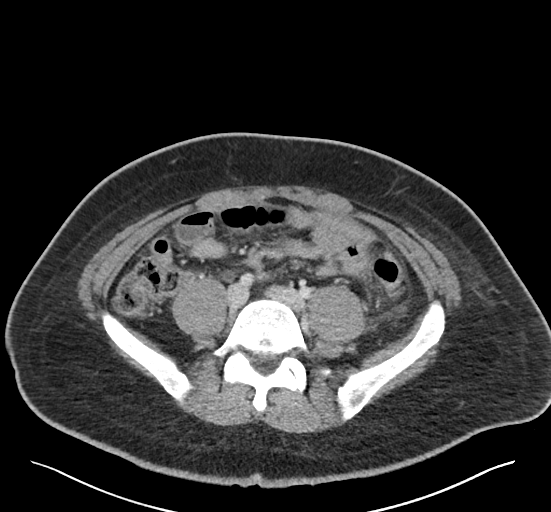
[im 66/111  soft-tissue]
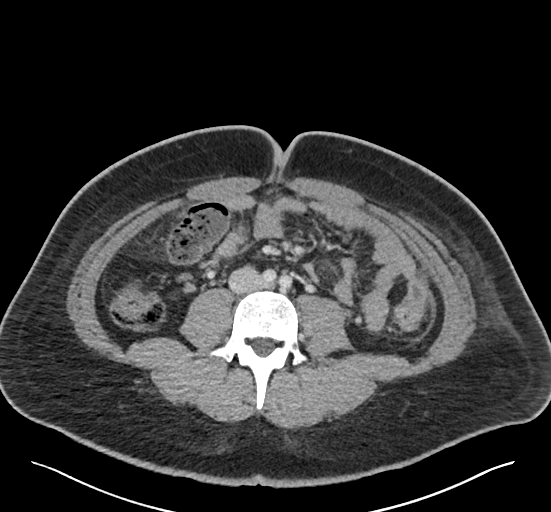
[im 74/111  soft-tissue]
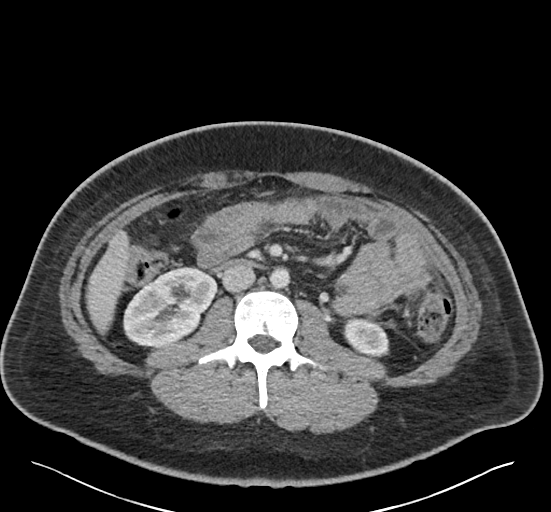
[im 86/111  soft-tissue]
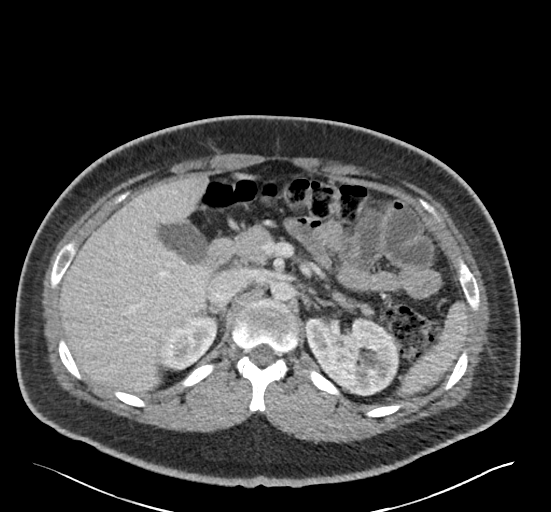
[im 86/111  bone]
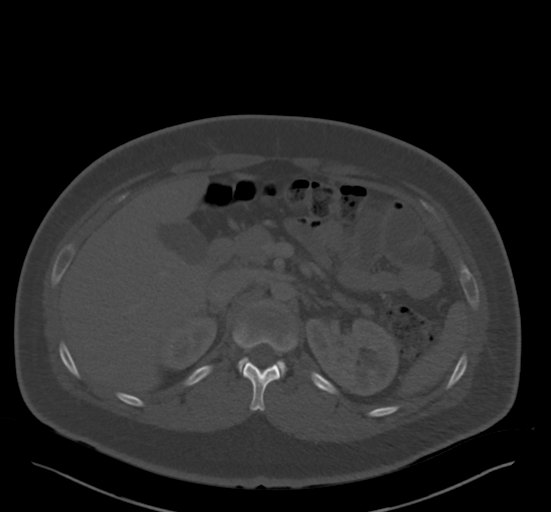
[im 94/111  soft-tissue]
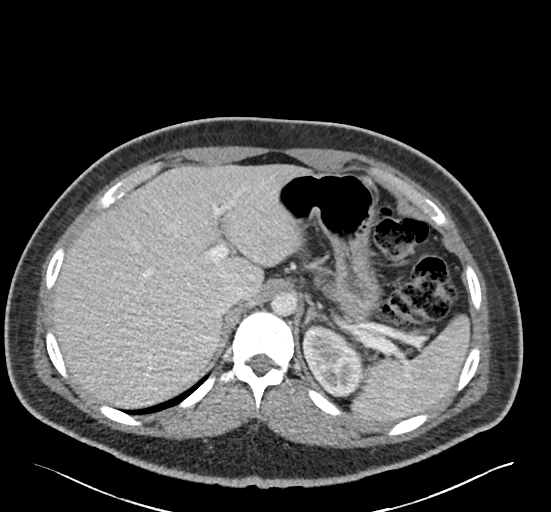
[im 102/111  soft-tissue]
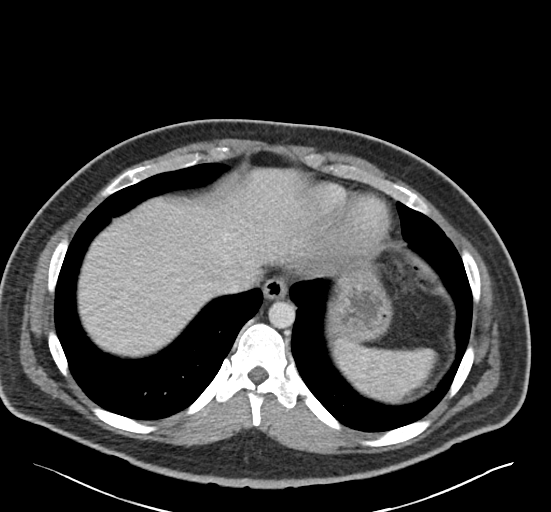

[Series 6: abd pelvis 2.00 br40 s3 cor · coronal · 0.85mm/px · 3 of 165 slices shown]
[im 55/165  soft-tissue]
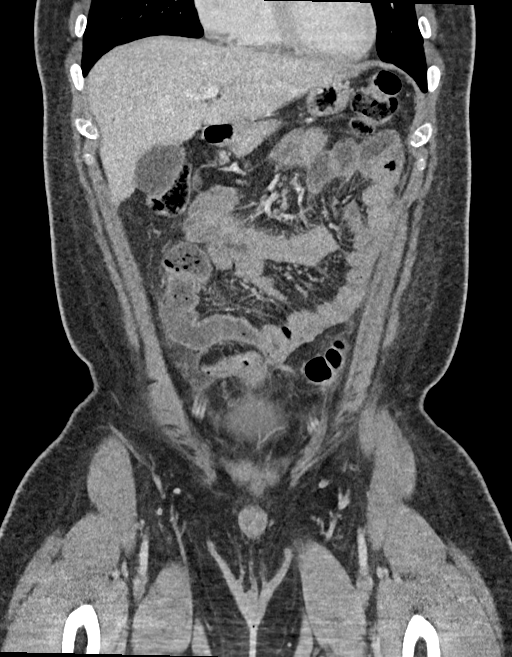
[im 73/165  soft-tissue]
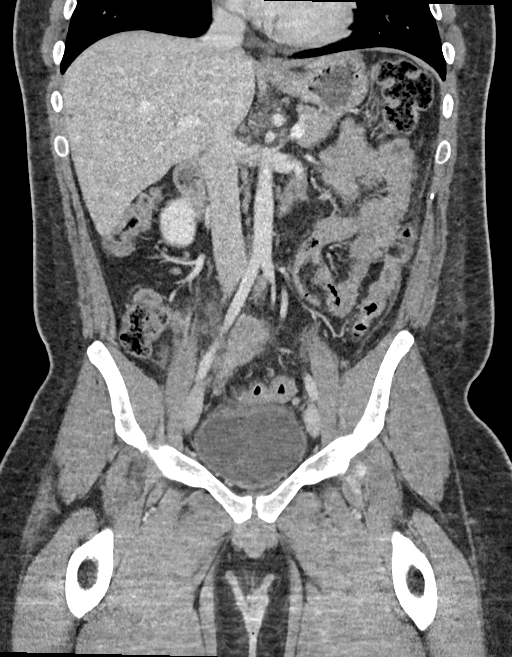
[im 92/165  soft-tissue]
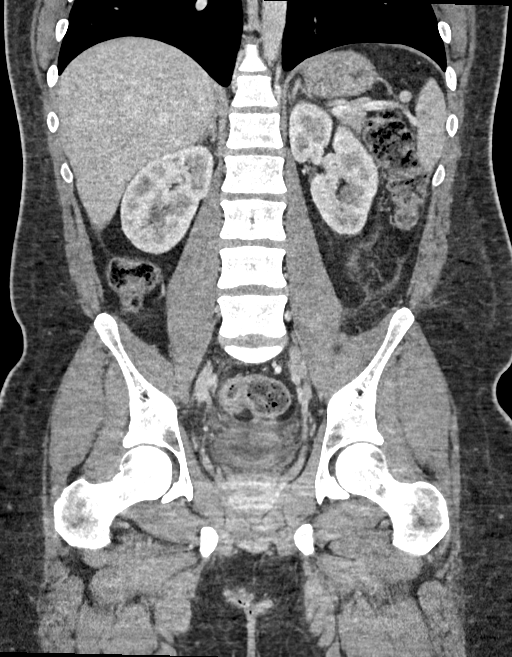

[14 of 46 positions shown; findings below may reference images not displayed]

RADIATION DOSE REDUCTION: This exam was performed according to the
departmental dose-optimization program which includes automated
exposure control, adjustment of the mA and/or kV according to
patient size and/or use of iterative reconstruction technique.

CONTRAST:  100mL 1QVR18-QXX IOPAMIDOL (1QVR18-QXX) INJECTION 61%
FINDINGS: Lower chest: Small dependent densities in the left lower lobe
probably represent atelectasis. Otherwise, the lung bases are clear
without pleural effusions.

Hepatobiliary: No focal liver abnormality is seen. No gallstones,
gallbladder wall thickening, or biliary dilatation.

Pancreas: Unremarkable. No pancreatic ductal dilatation or
surrounding inflammatory changes.

Spleen: Normal in size without focal abnormality.

Adrenals/Urinary Tract: Normal adrenal glands. Normal appearance of
both kidneys without hydronephrosis. No suspicious renal lesions.
Normal appearance of the urinary bladder.

Stomach/Bowel: Appendix is probably present on sequence 2 image 56
and there is no focal dilatation or acute inflammatory changes
around the appendix. No bowel dilatation. Normal appearance of the
stomach.

Vascular/Lymphatic: No significant vascular findings are present. No
enlarged abdominal or pelvic lymph nodes.

Reproductive: Prostate is unremarkable.

Other: Mild presacral stranding or edema is unchanged. Residual
stranding along the anterior aspect of the pelvis. The small
air-fluid collections in the right lower abdomen and pelvis have
essentially resolved. Previously, there was an interloop fluid
collection in the left abdomen that measured up to 3.3 cm and there
may be a tiny residual collection in this area measuring 0.8 cm.
Previously, there was a left abdominal interloop collection
measuring up to 3.8 cm that has probably resolved but there may be a
small remnant of this collection on sequence 2 image 40 measuring up
to 1.3 cm. Overall, the fluid collections have resolved or nearly
resolved from the previous examination. No new fluid or abscess
collections.

Musculoskeletal: No acute osseous findings.
IMPRESSION: 1. The intra-abdominal collections have resolved or nearly resolved
as described. No new fluid or abscess collections.
2. Mild residual inflammatory changes in the lower abdomen and
pelvis.
